# Patient Record
Sex: Male | Born: 1975 | Race: White | Hispanic: No | Smoking: Current some day smoker
Health system: Southern US, Community
[De-identification: ages and names within clinical notes are randomized; demographics above are authoritative.]

---

## 2020-01-14 ENCOUNTER — Emergency Department (HOSPITAL_BASED_OUTPATIENT_CLINIC_OR_DEPARTMENT_OTHER): Payer: Managed Care, Other (non HMO)

## 2020-01-14 ENCOUNTER — Encounter (HOSPITAL_BASED_OUTPATIENT_CLINIC_OR_DEPARTMENT_OTHER): Payer: Self-pay

## 2020-01-14 ENCOUNTER — Other Ambulatory Visit: Payer: Self-pay

## 2020-01-14 ENCOUNTER — Emergency Department (HOSPITAL_BASED_OUTPATIENT_CLINIC_OR_DEPARTMENT_OTHER)
Admission: EM | Admit: 2020-01-14 | Discharge: 2020-01-14 | Discharge status: Against Medical Advice | Payer: Managed Care, Other (non HMO) | Source: Home / Self Care | Attending: Emergency Medicine | Admitting: Emergency Medicine

## 2020-01-14 DIAGNOSIS — Z5329 Procedure and treatment not carried out because of patient's decision for other reasons: Secondary | ICD-10-CM | POA: Insufficient documentation

## 2020-01-14 DIAGNOSIS — B159 Hepatitis A without hepatic coma: Secondary | ICD-10-CM | POA: Diagnosis not present

## 2020-01-14 DIAGNOSIS — K819 Cholecystitis, unspecified: Secondary | ICD-10-CM

## 2020-01-14 DIAGNOSIS — F17200 Nicotine dependence, unspecified, uncomplicated: Secondary | ICD-10-CM | POA: Insufficient documentation

## 2020-01-14 DIAGNOSIS — R17 Unspecified jaundice: Secondary | ICD-10-CM | POA: Diagnosis not present

## 2020-01-14 DIAGNOSIS — K831 Obstruction of bile duct: Secondary | ICD-10-CM

## 2020-01-14 DIAGNOSIS — R1011 Right upper quadrant pain: Secondary | ICD-10-CM

## 2020-01-14 LAB — COMPREHENSIVE METABOLIC PANEL
ALT: 1307 U/L — ABNORMAL HIGH (ref 0–44)
AST: 691 U/L — ABNORMAL HIGH (ref 15–41)
Albumin: 3.3 g/dL — ABNORMAL LOW (ref 3.5–5.0)
Alkaline Phosphatase: 176 U/L — ABNORMAL HIGH (ref 38–126)
Anion gap: 8 (ref 5–15)
BUN: 12 mg/dL (ref 6–20)
CO2: 25 mmol/L (ref 22–32)
Calcium: 8.3 mg/dL — ABNORMAL LOW (ref 8.9–10.3)
Chloride: 105 mmol/L (ref 98–111)
Creatinine, Ser: 0.7 mg/dL (ref 0.61–1.24)
GFR calc Af Amer: >60 mL/min (ref >60)
GFR calc non Af Amer: >60 mL/min (ref >60)
Glucose, Bld: 117 mg/dL — ABNORMAL HIGH (ref 70–99)
Potassium: 3.6 mmol/L (ref 3.5–5.1)
Sodium: 138 mmol/L (ref 135–145)
Total Bilirubin: 6.3 mg/dL — ABNORMAL HIGH (ref 0.3–1.2)
Total Protein: 6.9 g/dL (ref 6.5–8.1)

## 2020-01-14 LAB — CBC WITH DIFFERENTIAL/PLATELET
Abs Immature Granulocytes: 0.03 10*3/uL (ref 0.00–0.07)
Basophils Absolute: 0.1 10*3/uL (ref 0.0–0.1)
Basophils Relative: 1 %
Eosinophils Absolute: 0.1 10*3/uL (ref 0.0–0.5)
Eosinophils Relative: 2 %
HCT: 41.1 % (ref 39.0–52.0)
Hemoglobin: 13.7 g/dL (ref 13.0–17.0)
Immature Granulocytes: 1 %
Lymphocytes Relative: 39 %
Lymphs Abs: 2.5 10*3/uL (ref 0.7–4.0)
MCH: 30.2 pg (ref 26.0–34.0)
MCHC: 33.3 g/dL (ref 30.0–36.0)
MCV: 90.7 fL (ref 80.0–100.0)
Monocytes Absolute: 0.7 10*3/uL (ref 0.1–1.0)
Monocytes Relative: 11 %
Neutro Abs: 3 10*3/uL (ref 1.7–7.7)
Neutrophils Relative %: 46 %
Platelets: 201 10*3/uL (ref 150–400)
RBC: 4.53 MIL/uL (ref 4.22–5.81)
RDW: 14.9 % (ref 11.5–15.5)
WBC: 6.4 10*3/uL (ref 4.0–10.5)
nRBC: 0 % (ref 0.0–0.2)

## 2020-01-14 LAB — URINALYSIS, ROUTINE W REFLEX MICROSCOPIC
Glucose, UA: 100 mg/dL — AB
Hgb urine dipstick: NEGATIVE
Ketones, ur: 15 mg/dL — AB
Nitrite: NEGATIVE
Protein, ur: 30 mg/dL — AB
Specific Gravity, Urine: >1.03 — ABNORMAL HIGH (ref 1.005–1.030)
pH: 5.5 (ref 5.0–8.0)

## 2020-01-14 LAB — URINALYSIS, MICROSCOPIC (REFLEX)

## 2020-01-14 LAB — LIPASE, BLOOD: Lipase: 40 U/L (ref 11–51)

## 2020-01-14 MED ORDER — AMOXICILLIN-POT CLAVULANATE 875-125 MG PO TABS: 1.0000 | ORAL_TABLET | Freq: Two times a day (BID) | ORAL | 0 refills | Status: DC

## 2020-01-14 MED ORDER — PIPERACILLIN-TAZOBACTAM 4.5 G IVPB
4.5000 g | Freq: Once | INTRAVENOUS | Status: DC
  Filled 2020-01-14: qty 100

## 2020-01-14 MED ORDER — PIPERACILLIN-TAZOBACTAM 3.375 G IVPB
3.3750 g | Freq: Once | INTRAVENOUS | Status: DC
  Filled 2020-01-14: qty 50

## 2020-01-14 NOTE — ED Triage Notes (Signed)
Pt also reports noticing his eyes are yellow.

## 2020-01-14 NOTE — ED Notes (Signed)
Pt refuses to be admitted, leave AMA, pt oriented by this RN and provider about his condition and encouraged to stay and get treatment. Pt still refuses and leaved AMA.

## 2020-01-14 NOTE — ED Provider Notes (Signed)
[facility] [place] EMERGENCY DEPARTMENT Provider Note   CSN: [id number] Arrival date & time: 01/14/20  1844     History Chief Complaint  Patient presents with  . Abdominal Pain    Antonio Bright is a 44 y.o. male.  HPI Patient with no prior medical problems reports about a week of upper abdominal discomfort, worse after eating and associated with nausea but no vomiting. He has noticed some yellowing of his eyes. Symptoms may have started after eating some undercooked chicken. He was seen at an outpatient Urgent Care and was directed to the ED for evaluation. He denies any drug or alcohol use. Does not take tylenol. Denies fever.     History reviewed. No pertinent past medical history.  There are no problems to display for this patient.   History reviewed. No pertinent surgical history.     No family history on file.  Social History   Tobacco Use  . Smoking status: Current Some Day Smoker  . Smokeless tobacco: Never Used  Substance Use Topics  . Alcohol use: Never  . Drug use: Never    Home Medications Prior to Admission medications   Medication Sig Start Date End Date Taking? Authorizing Provider  amoxicillin-clavulanate (AUGMENTIN) 875-125 MG tablet Take 1 tablet by mouth every 12 (twelve) hours. 01/14/20   Truddie Hidden, MD    Allergies    Patient has no known allergies.  Review of Systems   Review of Systems  Constitutional: Negative for fever.  HENT: Negative for congestion and sore throat.   Respiratory: Negative for cough and shortness of breath.   Cardiovascular: Negative for chest pain.  Gastrointestinal: Positive for abdominal pain and nausea. Negative for diarrhea and vomiting.  Genitourinary: Negative for dysuria.  Musculoskeletal: Negative for myalgias.  Skin: Positive for color change. Negative for rash.  Neurological: Negative for headaches.  Psychiatric/Behavioral: Negative for behavioral problems.    Physical Exam Updated Vital  Signs BP 125/70 (BP Location: Right Arm)   Pulse 88   Temp 98.5 F (36.9 C) (Oral)   Resp 18   Ht 5\' 11"  (1.803 m)   Wt 104.3 kg   SpO2 95%   BMI 32.08 kg/m   Physical Exam Constitutional:      Appearance: Normal appearance.  HENT:     Head: Normocephalic and atraumatic.     Nose: Nose normal.     Mouth/Throat:     Mouth: Mucous membranes are moist.  Eyes:     General: Scleral icterus (mild) present.     Extraocular Movements: Extraocular movements intact.     Conjunctiva/sclera: Conjunctivae normal.  Cardiovascular:     Rate and Rhythm: Normal rate.  Pulmonary:     Effort: Pulmonary effort is normal.     Breath sounds: Normal breath sounds.  Abdominal:     General: Abdomen is flat.     Palpations: Abdomen is soft.     Tenderness: There is abdominal tenderness (mild) in the right upper quadrant. Negative signs include Murphy's sign.  Musculoskeletal:        General: No swelling. Normal range of motion.     Cervical back: Neck supple.  Skin:    General: Skin is warm and dry.  Neurological:     General: No focal deficit present.     Mental Status: He is alert.  Psychiatric:        Mood and Affect: Mood normal.     ED Results / Procedures / Treatments   Labs (all labs  ordered are listed, but only abnormal results are displayed) Labs Reviewed  COMPREHENSIVE METABOLIC PANEL - Abnormal; Notable for the following components:      Result Value   Glucose, Bld 117 (*)    Calcium 8.3 (*)    Albumin 3.3 (*)    AST 691 (*)    ALT 1,307 (*)    Alkaline Phosphatase 176 (*)    Total Bilirubin 6.3 (*)    All other components within normal limits  URINALYSIS, ROUTINE W REFLEX MICROSCOPIC - Abnormal; Notable for the following components:   Color, Urine AMBER (*)    Specific Gravity, Urine >1.030 (*)    Glucose, UA 100 (*)    Bilirubin Urine LARGE (*)    Ketones, ur 15 (*)    Protein, ur 30 (*)    Leukocytes,Ua TRACE (*)    All other components within normal limits    URINALYSIS, MICROSCOPIC (REFLEX) - Abnormal; Notable for the following components:   Bacteria, UA MANY (*)    All other components within normal limits  SARS CORONAVIRUS 2 (TAT 6-24 HRS)  CBC WITH DIFFERENTIAL/PLATELET  LIPASE, BLOOD    EKG None  Radiology US Abdomen Limited RUQ  Result Date: 01/14/2020 CLINICAL DATA:  Postprandial upper abdominal pain with nausea for 6 days EXAM: ULTRASOUND ABDOMEN LIMITED RIGHT UPPER QUADRANT COMPARISON:  None. FINDINGS: Gallbladder: Gallbladder appears partially contracted. There is heterogeneous biliary sludge in the gallbladder including several partially echogenic shadowing foci, likely partially calcified and/or pneumatized gallstones largest measuring up to 5 mm in size. Gallbladder wall thickness measures up to 6 mm, which is thickened. Small volume of pericholecystic fluid is noted. Sonographic Eulah Pont sign is reportedly positive. Common bile duct: Diameter: Poorly visualized. Proximal visible portion measures 4 mm, nondilated. Liver: Diffusely increased hepatic echogenicity with loss of definition of the portal triads and diminished posterior through transmission compatible with hepatic steatosis. No focal lesion identified. Portal vein is patent on color Doppler imaging with normal direction of blood flow towards the liver. Other: None. IMPRESSION: Features compatible with acute cholecystitis including gallbladder wall thickening, pericholecystic fluid, and a positive sonographic Murphy sign. Gallbladder appears contracted around heterogeneous biliary sludge and echogenic foci, likely gallstones. Increased hepatic echogenicity, most often compatible with hepatic steatosis. Electronically Signed   By: Kreg Shropshire M.D.   On: 01/14/2020 20:29    Procedures Procedures (including critical care time)  Medications Ordered in ED Medications  piperacillin-tazobactam (ZOSYN) IVPB 3.375 g (3.375 g Intravenous Not Given 01/14/20 2253)    ED Course  I have  reviewed the triage vital signs and the nursing notes.  Pertinent labs & imaging results that were available during my care of the patient were reviewed by me and considered in my medical decision making (see chart for details).  Clinical Course as of Jan 14 2319  Sat Jan 14, 2020  2140 Patient with several days of upper abdominal pain, worse with eating and more recently some jaundice.  His labs were reviewed including elevated liver enzymes with a bilirubin of 6.3.  His white blood cell count is normal but his ultrasound is concerning for cholecystitis.  Will discuss with general surgery on-call.  Patient is resting comfortably in no distress.   [CS]  2202 Spoke with Dr. Cliffton Asters, general surgery and reviewed the lab and imaging findings.  He is concerned about a choledocholithiasis with ascending cholangitis and recommends admission to the medicine service for IV antibiotics and consultation with GI prior to cholecystectomy.  Zosyn has been  ordered and the hospitalist have been paged.   [CS]  2224 Spoke with Dr. Onalee Hua, Hospitalist who will admit the patient. She requests that GI also be consulted.    [CS]  2247 Spoke with Dr. Matthias Hughs, GI who agrees with the plan for admission, Abx and will anticipate ERCP tomorrow.    [CS]  2249 I returned to the room to discuss the above plan with the patient but he is sitting up in bed dressed. He now states he cannot stay in the hospital despite me telling him earlier that this would be the plan. He states he wants to spend some time with his family because this is his only weekend off and that he will return on Monday. He understands that he could have worsening pain, sepsis from infection, permanent liver damage or death. He understands the benefits of being admitted to the hospital but still refuses admission or IV Abx. He will agree to take oral antibiotics. He understands that he is welcome to return at any time and he understands that should he choose to do  so, he should go to the ER at [facility] or [facility].    [CS]    Clinical Course User Index [CS] Pollyann Savoy, MD   Final Clinical Impression(s) / ED Diagnoses Final diagnoses:  Cholecystitis  Obstructive jaundice    Rx / DC Orders ED Discharge Orders         Ordered    amoxicillin-clavulanate (AUGMENTIN) 875-125 MG tablet  Every 12 hours     01/14/20 2254           Pollyann Savoy, MD 01/14/20 2320

## 2020-01-14 NOTE — ED Notes (Signed)
Pt. Is aware that urine sample is needed.

## 2020-01-14 NOTE — ED Triage Notes (Signed)
Pt arrives ambulatory to ED with reports of eating undercooked chicken on Thursday. C/O chills and "feeling bad", with some abdominal pain and dark urine. Also reports decreased PO intake.

## 2020-01-14 NOTE — ED Notes (Signed)
Attempt to give Abx IV and collect a Covid test from the pt. Pt refuses states he will need to talk to the provider prior to this RN even though him. Provider notified.

## 2020-01-16 ENCOUNTER — Inpatient Hospital Stay (HOSPITAL_COMMUNITY): Payer: Managed Care, Other (non HMO)

## 2020-01-16 ENCOUNTER — Encounter (HOSPITAL_COMMUNITY): Payer: Self-pay | Admitting: Emergency Medicine

## 2020-01-16 ENCOUNTER — Other Ambulatory Visit: Payer: Self-pay

## 2020-01-16 ENCOUNTER — Inpatient Hospital Stay (HOSPITAL_COMMUNITY)
Admission: EM | Admit: 2020-01-16 | Discharge: 2020-01-17 | DRG: 443 | Disposition: A | Discharge status: Home / Self Care | Payer: Managed Care, Other (non HMO) | Attending: Family Medicine | Admitting: Family Medicine

## 2020-01-16 DIAGNOSIS — R634 Abnormal weight loss: Secondary | ICD-10-CM | POA: Diagnosis present

## 2020-01-16 DIAGNOSIS — Z6832 Body mass index (BMI) 32.0-32.9, adult: Secondary | ICD-10-CM | POA: Diagnosis not present

## 2020-01-16 DIAGNOSIS — F17210 Nicotine dependence, cigarettes, uncomplicated: Secondary | ICD-10-CM | POA: Diagnosis present

## 2020-01-16 DIAGNOSIS — R109 Unspecified abdominal pain: Secondary | ICD-10-CM

## 2020-01-16 DIAGNOSIS — Z87730 Personal history of (corrected) cleft lip and palate: Secondary | ICD-10-CM

## 2020-01-16 DIAGNOSIS — R21 Rash and other nonspecific skin eruption: Secondary | ICD-10-CM | POA: Diagnosis present

## 2020-01-16 DIAGNOSIS — E669 Obesity, unspecified: Secondary | ICD-10-CM | POA: Diagnosis present

## 2020-01-16 DIAGNOSIS — Z20822 Contact with and (suspected) exposure to covid-19: Secondary | ICD-10-CM | POA: Diagnosis present

## 2020-01-16 DIAGNOSIS — R945 Abnormal results of liver function studies: Secondary | ICD-10-CM | POA: Diagnosis not present

## 2020-01-16 DIAGNOSIS — K7689 Other specified diseases of liver: Secondary | ICD-10-CM | POA: Diagnosis not present

## 2020-01-16 DIAGNOSIS — K760 Fatty (change of) liver, not elsewhere classified: Secondary | ICD-10-CM | POA: Diagnosis present

## 2020-01-16 DIAGNOSIS — R17 Unspecified jaundice: Secondary | ICD-10-CM | POA: Diagnosis present

## 2020-01-16 DIAGNOSIS — K810 Acute cholecystitis: Secondary | ICD-10-CM

## 2020-01-16 DIAGNOSIS — B159 Hepatitis A without hepatic coma: Principal | ICD-10-CM | POA: Diagnosis present

## 2020-01-16 DIAGNOSIS — K8050 Calculus of bile duct without cholangitis or cholecystitis without obstruction: Secondary | ICD-10-CM

## 2020-01-16 LAB — COMPREHENSIVE METABOLIC PANEL
ALT: 964 U/L — ABNORMAL HIGH (ref 0–44)
AST: 309 U/L — ABNORMAL HIGH (ref 15–41)
Albumin: 3.6 g/dL (ref 3.5–5.0)
Alkaline Phosphatase: 207 U/L — ABNORMAL HIGH (ref 38–126)
Anion gap: 9 (ref 5–15)
BUN: 18 mg/dL (ref 6–20)
CO2: 25 mmol/L (ref 22–32)
Calcium: 9.1 mg/dL (ref 8.9–10.3)
Chloride: 107 mmol/L (ref 98–111)
Creatinine, Ser: 0.75 mg/dL (ref 0.61–1.24)
GFR calc Af Amer: >60 mL/min (ref >60)
GFR calc non Af Amer: >60 mL/min (ref >60)
Glucose, Bld: 121 mg/dL — ABNORMAL HIGH (ref 70–99)
Potassium: 3.6 mmol/L (ref 3.5–5.1)
Sodium: 141 mmol/L (ref 135–145)
Total Bilirubin: 6.5 mg/dL — ABNORMAL HIGH (ref 0.3–1.2)
Total Protein: 8 g/dL (ref 6.5–8.1)

## 2020-01-16 LAB — URINALYSIS, ROUTINE W REFLEX MICROSCOPIC
Glucose, UA: NEGATIVE mg/dL
Hgb urine dipstick: NEGATIVE
Ketones, ur: NEGATIVE mg/dL
Leukocytes,Ua: NEGATIVE
Nitrite: NEGATIVE
Protein, ur: NEGATIVE mg/dL
Specific Gravity, Urine: 1.025 (ref 1.005–1.030)
pH: 5 (ref 5.0–8.0)

## 2020-01-16 LAB — CBC
HCT: 46.2 % (ref 39.0–52.0)
Hemoglobin: 15.4 g/dL (ref 13.0–17.0)
MCH: 30.3 pg (ref 26.0–34.0)
MCHC: 33.3 g/dL (ref 30.0–36.0)
MCV: 90.8 fL (ref 80.0–100.0)
Platelets: 260 10*3/uL (ref 150–400)
RBC: 5.09 MIL/uL (ref 4.22–5.81)
RDW: 15.3 % (ref 11.5–15.5)
WBC: 7 10*3/uL (ref 4.0–10.5)
nRBC: 0 % (ref 0.0–0.2)

## 2020-01-16 LAB — RESPIRATORY PANEL BY RT PCR (FLU A&B, COVID)
Influenza A by PCR: NEGATIVE
Influenza B by PCR: NEGATIVE
SARS Coronavirus 2 by RT PCR: NEGATIVE

## 2020-01-16 LAB — LIPASE, BLOOD: Lipase: 44 U/L (ref 11–51)

## 2020-01-16 MED ORDER — GADOBUTROL 1 MMOL/ML IV SOLN
10.0000 mL | Freq: Once | INTRAVENOUS | Status: AC | PRN
  Administered 2020-01-16: 10 mL via INTRAVENOUS

## 2020-01-16 MED ORDER — ONDANSETRON HCL 4 MG PO TABS: 4.0000 mg | ORAL_TABLET | Freq: Four times a day (QID) | ORAL | Status: DC | PRN

## 2020-01-16 MED ORDER — SODIUM CHLORIDE 0.9 % IV SOLN: 250.0000 mL | INTRAVENOUS | Status: DC | PRN

## 2020-01-16 MED ORDER — PIPERACILLIN-TAZOBACTAM 3.375 G IVPB
3.3750 g | Freq: Three times a day (TID) | INTRAVENOUS | Status: DC
  Administered 2020-01-16 – 2020-01-17 (×3): 3.375 g via INTRAVENOUS
  Filled 2020-01-16 (×4): qty 50

## 2020-01-16 MED ORDER — HEPARIN SODIUM (PORCINE) 5000 UNIT/ML IJ SOLN: 5000.0000 [IU] | Freq: Three times a day (TID) | INTRAMUSCULAR | Status: DC

## 2020-01-16 MED ORDER — SODIUM CHLORIDE 0.9% FLUSH: 3.0000 mL | INTRAVENOUS | Status: DC | PRN

## 2020-01-16 MED ORDER — ONDANSETRON HCL 4 MG/2ML IJ SOLN: 4.0000 mg | Freq: Four times a day (QID) | INTRAMUSCULAR | Status: DC | PRN

## 2020-01-16 MED ORDER — SODIUM CHLORIDE 0.9% FLUSH
3.0000 mL | Freq: Two times a day (BID) | INTRAVENOUS | Status: DC
  Administered 2020-01-17: 3 mL via INTRAVENOUS

## 2020-01-16 MED ORDER — NICOTINE 14 MG/24HR TD PT24
14.0000 mg | MEDICATED_PATCH | Freq: Every day | TRANSDERMAL | Status: DC
  Filled 2020-01-16: qty 1

## 2020-01-16 MED ORDER — SODIUM CHLORIDE 0.9% FLUSH: 3.0000 mL | Freq: Once | INTRAVENOUS | Status: DC

## 2020-01-16 MED ORDER — POTASSIUM CHLORIDE IN NACL 20-0.45 MEQ/L-% IV SOLN
INTRAVENOUS | Status: DC
  Filled 2020-01-16: qty 1000

## 2020-01-16 MED ORDER — MORPHINE SULFATE (PF) 2 MG/ML IV SOLN: 2.0000 mg | INTRAVENOUS | Status: DC | PRN

## 2020-01-16 NOTE — ED Notes (Signed)
Called report to charge on 6th floor for pt transfer to the floor.

## 2020-01-16 NOTE — Progress Notes (Signed)
Patient ID: Antonio Bright, male   DOB: 29-Sep-1976, 44 y.o.   MRN: [id number]    Brief GI note  Notified by ER, patient with acute cholecystitis, and significantly elevated LFTs when he was evaluated 2 days ago, and declined admission.  Patient returned today with persistent abdominal pain.  Labs are pending today.  Will order MRCP this evening, and plan to follow-up in a.m. with complete GI note.

## 2020-01-16 NOTE — ED Provider Notes (Signed)
[facility]-EMERGENCY DEPT [facility] Emergency Department Provider Note MRN:  [id number]  Arrival date & time: 01/16/20     Chief Complaint   Abdominal Pain   History of Present Illness   Antonio Bright is a 44 y.o. year-old male with no pertinent past medical history presenting to the ED with chief complaint of abdominal pain.  Continued right upper quadrant abdominal pain, present for 3 to 5 days, was told that he had a gallbladder infection late last week, left AGAINST MEDICAL ADVICE.  He has returned today to undergo the necessary testing.  Did not want to sit around the hospital all weekend without surgery.  Denies any fever, intermittent chills, no chest pain or shortness of breath, right upper quadrant abdominal pain is mild to moderate, constant, largely unchanged.  He has noticed that he is becoming more jaundiced over the past few days.  Review of Systems  A complete 10 system review of systems was obtained and all systems are negative except as noted in the HPI and PMH.   Patient's Health History   History reviewed. No pertinent past medical history.  History reviewed. No pertinent surgical history.  No family history on file.  Social History   Socioeconomic History  . Marital status: Unknown    Spouse name: Not on file  . Number of children: Not on file  . Years of education: Not on file  . Highest education level: Not on file  Occupational History  . Not on file  Tobacco Use  . Smoking status: Current Some Day Smoker  . Smokeless tobacco: Never Used  Substance and Sexual Activity  . Alcohol use: Never  . Drug use: Never  . Sexual activity: Not on file  Other Topics Concern  . Not on file  Social History Narrative  . Not on file   Social Determinants of Health   Financial Resource Strain:   . Difficulty of Paying Living Expenses: Not on file  Food Insecurity:   . Worried About Programme researcher, broadcasting/film/video in the Last Year: Not on file  . Ran Out of Food in the  Last Year: Not on file  Transportation Needs:   . Lack of Transportation (Medical): Not on file  . Lack of Transportation (Non-Medical): Not on file  Physical Activity:   . Days of Exercise per Week: Not on file  . Minutes of Exercise per Session: Not on file  Stress:   . Feeling of Stress : Not on file  Social Connections:   . Frequency of Communication with Friends and Family: Not on file  . Frequency of Social Gatherings with Friends and Family: Not on file  . Attends Religious Services: Not on file  . Active Member of Clubs or Organizations: Not on file  . Attends Banker Meetings: Not on file  . Marital Status: Not on file  Intimate Partner Violence:   . Fear of Current or Ex-Partner: Not on file  . Emotionally Abused: Not on file  . Physically Abused: Not on file  . Sexually Abused: Not on file     Physical Exam   Vitals:   01/16/20 1806 01/16/20 2057  BP: 124/77 112/74  Pulse: 75 63  Resp: 20 16  Temp: 98.3 F (36.8 C) 98.3 F (36.8 C)  SpO2: 99% 98%    CONSTITUTIONAL: Well-appearing, NAD NEURO:  Alert and oriented x 3, no focal deficits EYES:  eyes equal and reactive ENT/NECK:  no LAD, no JVD CARDIO: Regular rate, well-perfused,  normal S1 and S2 PULM:  CTAB no wheezing or rhonchi GI/GU:  normal bowel sounds, non-distended, mild right upper quadrant tenderness to palpation MSK/SPINE:  No gross deformities, no edema SKIN:  no rash, atraumatic PSYCH:  Appropriate speech and behavior  *Additional and/or pertinent findings included in MDM below  Diagnostic and Interventional Summary    EKG Interpretation  Date/Time:    Ventricular Rate:    PR Interval:    QRS Duration:   QT Interval:    QTC Calculation:   R Axis:     Text Interpretation:        Cardiac Monitoring Interpretation:  Labs Reviewed  COMPREHENSIVE METABOLIC PANEL - Abnormal; Notable for the following components:      Result Value   Glucose, Bld 121 (*)    AST 309 (*)     ALT 964 (*)    Alkaline Phosphatase 207 (*)    Total Bilirubin 6.5 (*)    All other components within normal limits  URINALYSIS, ROUTINE W REFLEX MICROSCOPIC - Abnormal; Notable for the following components:   Color, Urine AMBER (*)    Bilirubin Urine MODERATE (*)    All other components within normal limits  RESPIRATORY PANEL BY RT PCR (FLU A&B, COVID)  LIPASE, BLOOD  CBC  CBC  COMPREHENSIVE METABOLIC PANEL  PROTIME-INR  HIV ANTIBODY (ROUTINE TESTING W REFLEX)  APTT  HEMOGLOBIN A1C    MR ABDOMEN MRCP W WO CONTAST  Final Result    MR 3D Recon At Scanner  Final Result      Medications  sodium chloride flush (NS) 0.9 % injection 3 mL (has no administration in time range)  heparin injection 5,000 Units (0 Units Subcutaneous Hold 01/16/20 2038)  sodium chloride flush (NS) 0.9 % injection 3 mL (3 mLs Intravenous Not Given 01/16/20 2045)  sodium chloride flush (NS) 0.9 % injection 3 mL (has no administration in time range)  0.9 %  sodium chloride infusion (has no administration in time range)  ondansetron (ZOFRAN) tablet 4 mg (has no administration in time range)    Or  ondansetron (ZOFRAN) injection 4 mg (has no administration in time range)  morphine 2 MG/ML injection 2-4 mg (has no administration in time range)  piperacillin-tazobactam (ZOSYN) IVPB 3.375 g (3.375 g Intravenous New Bag/Given 01/16/20 2127)  0.45 % NaCl with KCl 20 mEq / L infusion ( Intravenous New Bag/Given 01/16/20 2044)  nicotine (NICODERM CQ - dosed in mg/24 hours) patch 14 mg (14 mg Transdermal Not Given 01/16/20 2044)  gadobutrol (GADAVIST) 1 MMOL/ML injection 10 mL (10 mLs Intravenous Contrast Given 01/16/20 1919)     Procedures  /  Critical Care .Critical Care Performed by: Maudie Flakes, MD Authorized by: Maudie Flakes, MD   Critical care provider statement:    Critical care time (minutes):  32   Critical care was necessary to treat or prevent imminent or life-threatening deterioration of the following  conditions: Acute cholecystitis, concern for acute choledocholithiasis with hepatic dysfunction.   Critical care was time spent personally by me on the following activities:  Discussions with consultants, evaluation of patient's response to treatment, examination of patient, ordering and performing treatments and interventions, ordering and review of laboratory studies, ordering and review of radiographic studies, pulse oximetry, re-evaluation of patient's condition, obtaining history from patient or surrogate and review of old charts    ED Course and Medical Decision Making  I have reviewed the triage vital signs, the nursing notes, and pertinent available records from  the EMR.  Pertinent labs & imaging results that were available during my care of the patient were reviewed by me and considered in my medical decision making (see below for details).     Signs of acute cholecystitis on ultrasound but also with some concerns for choledocholithiasis given his recent LFTs.  Per chart review, patient left AGAINST MEDICAL ADVICE a few days ago.  General surgery was consulted who recommended hospitalist admission and GI involvement for ERCP or MRCP.  Patient is now in full agreement that he will stay for what is necessary.  Currently without fever, no signs of peritonitis on exam, largely well-appearing, mildly jaundiced.  Doubt ascending cholangitis at this time, but still consult hospitalist and gastroenterology for inpatient management.  Patient took Augmentin this morning.  Accepted for admission by hospitalist service, lobe our GI will follow.  Elmer Sow. Pilar Plate, MD [facility] Emergency Medicine [facility] [email or web address]  Final Clinical Impressions(s) / ED Diagnoses     ICD-10-CM   1. Acute cholecystitis  K81.0   2. Choledocholithiasis  K80.50   3. Abdominal pain  R10.9 MR 3D Recon At Scanner    MR 3D Recon At Scanner    ED Discharge Orders    None       Discharge  Instructions Discussed with and Provided to Patient:   Discharge Instructions   None       Sabas Sous, MD 01/16/20 2333

## 2020-01-16 NOTE — Progress Notes (Signed)
Pharmacy Antibiotic Note  Antonio Bright is a 44 y.o. male admitted on 01/16/2020 with acute cholecystitis.  Pharmacy has been consulted for Zosyn dosing.  Plan: Zosyn 3.375g IV q8h (each dose infused over 4 hours) Monitor renal function, cultures, clinical course     Temp (24hrs), Avg:98.1 F (36.7 C), Min:98.1 F (36.7 C), Max:98.1 F (36.7 C)  Recent Labs  Lab 01/14/20 1950 01/16/20 1552  WBC 6.4 7.0  CREATININE 0.70 0.75    Estimated Creatinine Clearance: 143.3 mL/min (by C-G formula based on SCr of 0.75 mg/dL).    No Known Allergies  Antimicrobials this admission: 3/8 Zosyn >>  Dose adjustments this admission: --  Microbiology results: 3/8 Respiratory panel: sent   Thank you for allowing pharmacy to be a part of this patient's care.  Jamse Mead 01/16/2020 5:00 PM

## 2020-01-16 NOTE — Consult Note (Signed)
[facility] Surgery Consult Note  Antonio Bright 1976-03-27  [id number].    Requesting MD: Vance Gather Chief Complaint: Abdominal pain, dark urine, eyes turning yellow Reason for Consult: Possible choledocholithiasis/ascending cholangitis   HPI: Patient is a 44 year old male who was seen at [facility] on 01/14/2020.  His primary complaint was postprandial upper abdominal pain with nausea x 6 days.    Work-up at that time showed a WBC of 6.4, hemoglobin of 13, hematocrit of 41, platelets 201,000.  Glucose 117, calcium 8.3, alk phos 176, AST 691, ALT 1307, total bilirubin 6.3.  Lipase was 40.  Abdominal ultrasound shows the gallbladder was partially contracted there is biliary sludge in the gallbladder including several partially echogenic shadowing foci suggestive of gallstones up to 5 mm.  The gallbladder wall thickness was 6 mm which is thickened.  There is small volume of pericholecystic fluid.  Common bile duct was poorly visualized but measures at 4 mm proximally.  This was compatible with acute cholecystitis, sludge and likely cholelithiasis, increased hepatic echogenicity compatible with hepatic steatosis.  It was recommended the patient be admitted and placed on IV Zosyn.  Apparently the patient opted to go home and come back today.  He presented this afternoon to the ED with complaints of right upper quadrant pain/discomfort nausea, and returned today thinking he would not be able to have any surgery until Monday or Tuesday of this week.  Labs today: WBC 7.0, hemoglobin 15.4, hematocrit 46.2, platelets 260,000.  Sodium is 141, potassium is 3.6, glucose 121, creatinine 0.75, alk phos 207, AST 309, ALT 964, total bilirubin 6.5.  Urinalysis on 3 6 showed large amount of bilirubin in the urine, today he has a moderate amount of bilirubin in the urine.  We are asked to see.   ROS: Review of Systems  Constitutional: Positive for weight loss (~ 10 lbs over the last week).   HENT: Negative.   Eyes: Negative.   Respiratory: Positive for cough (occasional).   Cardiovascular: Negative.   Gastrointestinal: Positive for heartburn and nausea. Negative for abdominal pain, blood in stool, constipation, diarrhea, melena and vomiting.  Genitourinary: Negative.   Musculoskeletal: Negative.   Skin: Positive for rash (rash lower abdomen for a few weeks).  Neurological: Negative.   Psychiatric/Behavioral: Negative.     No family history on file.  History reviewed. No pertinent past medical history.  History reviewed. No pertinent surgical history. 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 He has never used smokeless tobacco. He reports that he does not drink alcohol or use drugs. Tobacco: 1.5 PPD x 28 years, down to about 4 per day last week when he has been feeling bad.  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 Authorizing Provider  amoxicillin-clavulanate (AUGMENTIN) 875-125 MG tablet Take 1 tablet by mouth every 12 (twelve) hours. 01/14/20  Yes Truddie Hidden, MD     Blood pressure 118/70, pulse 77, temperature 98.1 F (36.7 C), temperature source Oral, resp. rate 16, SpO2 99 %.   Physical Exam:  General: pleasant, WD, WN white male who is laying in bed in NAD HEENT: head is normocephalic, atraumatic.  Sclera are mildly jaundice.  pupils are equal.   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 Heart: regular, rate, and rhythm.  Normal s1,s2. No obvious murmurs, gallops, or rubs noted.  Palpable radial and pedal pulses bilaterally Lungs: CTAB, no wheezes, rhonchi, or rales noted.  Some upper airway wheeze with deep inspiration, Respiratory effort nonlabored Abd: soft, NT, ND, +BS, no masses,  hernias, or organomegaly MS: all 4 extremities are symmetrical with no cyanosis, clubbing, or edema. Skin: warm and dry with no masses, lesions, or rashes Neuro: Cranial nerves 2-12 grossly intact, sensation is normal throughout Psych: A&Ox3 with an appropriate affect.   Results for orders placed or performed during the hospital encounter of 01/16/20 (from the past 48 hour(s))  CBC     Status: None   Collection Time: 01/16/20  3:52 PM  Result Value Ref Range   WBC 7.0 4.0 - 10.5 K/uL   RBC 5.09 4.22 - 5.81 MIL/uL   Hemoglobin 15.4 13.0 - 17.0 g/dL   HCT 46.2 39.0 - 52.0 %   MCV 90.8 80.0 - 100.0 fL   MCH 30.3 26.0 - 34.0 pg   MCHC 33.3 30.0 - 36.0 g/dL   RDW 15.3 11.5 - 15.5 %   Platelets 260 150 - 400 K/uL   nRBC 0.0 0.0 - 0.2 %    Comment: Performed at [facility], [place] [address] [place], [place] [address]  Urinalysis, Routine w reflex microscopic     Status: Abnormal   Collection Time: 01/16/20  4:16 PM  Result Value Ref Range   Color, Urine AMBER (A) YELLOW    Comment: BIOCHEMICALS MAY BE AFFECTED BY COLOR   APPearance CLEAR CLEAR   Specific Gravity, Urine 1.025 1.005 - 1.030   pH 5.0 5.0 - 8.0   Glucose, UA NEGATIVE NEGATIVE mg/dL   Hgb urine dipstick NEGATIVE NEGATIVE   Bilirubin Urine MODERATE (A) NEGATIVE   Ketones, ur NEGATIVE NEGATIVE mg/dL   Protein, ur NEGATIVE NEGATIVE mg/dL   Nitrite NEGATIVE NEGATIVE   Leukocytes,Ua NEGATIVE NEGATIVE    Comment: Performed at [facility], [place] [address] [place], [place] [address]   US Abdomen Limited RUQ  Result Date: 01/14/2020 CLINICAL DATA:  Postprandial upper abdominal pain with nausea for 6 days EXAM: ULTRASOUND ABDOMEN LIMITED RIGHT UPPER QUADRANT COMPARISON:  None. FINDINGS: Gallbladder: Gallbladder appears partially contracted. There is heterogeneous biliary sludge in the gallbladder including several partially echogenic shadowing foci, likely partially calcified and/or pneumatized  gallstones largest measuring up to 5 mm in size. Gallbladder wall thickness measures up to 6 mm, which is thickened. Small volume of pericholecystic fluid is noted. Sonographic Percell Miller sign is reportedly positive. Common bile duct: Diameter: Poorly visualized. Proximal visible portion measures 4 mm, nondilated. Liver: Diffusely increased hepatic echogenicity with loss of definition of the portal triads and diminished posterior through transmission compatible with hepatic steatosis. No focal lesion identified. Portal vein is patent on color Doppler imaging with normal direction of blood flow towards the liver. Other: None. IMPRESSION: Features compatible with acute cholecystitis including gallbladder wall thickening, pericholecystic fluid, and a positive sonographic Murphy sign. Gallbladder appears contracted around heterogeneous biliary sludge and echogenic foci, likely gallstones. Increased hepatic echogenicity, most often compatible with hepatic steatosis. Electronically Signed   By: Lovena Le M.D.   On: 01/14/2020 20:29      Assessment/Plan  Cholelithiasis/choledocholithiasis - possible cholecystitis Jaundice with elevated LFT Tobacco use  Plan:  GI has already ordered an MRI.  Agree with admit, I would start Zosyn, he has been on  Augmentin at home.  Rehydrate, clears after his MRI, then NPO after midnight.  We will see him again in the AM.  Repeat labs in [place], [facility] Surgery 01/16/2020, 4:36 PM Please see Amion for pager number during day hours 7:00am-4:30pm

## 2020-01-16 NOTE — ED Notes (Signed)
Pt is aware we need a urine specimen.  °

## 2020-01-16 NOTE — ED Notes (Signed)
Hospitalist at pt bedside.

## 2020-01-16 NOTE — H&P (Signed)
History and Physical   Kashton Mcartor IZT:[id number] DOB: 01-Jan-1976 DOA: 01/16/2020  Referring MD/NP/PA: Dr. Sedonia Small PCP: Patient, No Pcp Per Outpatient Specialists: None  Patient coming from: Home  Chief Complaint: Jaundice, abdominal pain  HPI: Menno Vanbergen is a 44 y.o. male with no significant medical history or follow up who presented to the ED today with continued abdominal pain and jaundice after leaving the ED on 3/6 having been diagnosed with cholecystitis with obstructive jaundice. Work up at that time revealed elevated LFTs (AST 691, ALT 1,307, TBili 6.3), unremarkable CBC. RUQ U/S demonstrated a thickened, contracted gallbladder with pericholecystic fluid and sludge as well as only partially visualized CBD at 52m and increased hepatic echogenicity consistent with steatosis. Admission was recommended, though patient left AMA, but returns now with constant, mild waxing/waning discomfort in the right upper abdomen that is not worse with movement nor eating (ate mac and cheese just prior to presenting back to ED), and for which no medications are taken other than augmentin. It is stable from prior. He denies nausea, vomiting, diarrhea, constipation. CBC unremarkable. CMP confirms LFT elevations. Patient is admitted, made NPO, started on zosyn .  - ROS: Denies fever, chills, weight loss, changes in vision or hearing, headache, cough, sore throat, chest pain, palpitations, shortness of breath, blood in stool, change in bladder habits, myalgias, arthralgias, rash, and per HPI. All others reviewed and are negative.   - No known PMH, has not seen a PCP for years.  - Takes no medications regularly, the rare ibuprofen prn.  - NKDA - Surgical history of cleft lip and palate repair, tracheostomy. No known anesthesia complications - Smoked from age 89up until 4 days ago with a few years hiatus when first child was young. - Overall ~1 ppd, 1/2ppd recently. 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 Lives with wife, has 15yo and 18yo sons. No illicit drugs.  - No FH of clots reported  Physical Exam: Vitals:   01/16/20 1540 01/16/20 1619  BP: 133/88 118/70  Pulse: 80 77  Resp: 16 16  Temp: 98.1 F (36.7 C)   TempSrc: Oral   SpO2: 100% 99%   Constitutional: Nontoxic male in no distress, calm demeanor Eyes: Icteric sclerae ENMT: Masked throughout encounter Neck: Supple, no masses, no thyromegaly Respiratory: Non-labored breathing room air without accessory muscle use. Clear breath sounds to auscultation bilaterally Cardiovascular: Regular rate and rhythm, no murmurs, rubs, or gallops. No carotid bruits. No JVD. No LE edema. Palpable distal pulses. Abdomen: Normoactive bowel sounds. RUQ point tenderness, non-distended, and no masses palpated. No hepatosplenomegaly. GU: No indwelling catheter Musculoskeletal: No clubbing / cyanosis. No joint deformity upper and lower extremities. Good ROM, no contractures. Normal muscle tone.  Skin: Warm, dry, diffusely slightly jaundiced. Midline neck w/trach scar. No rashes, wounds, or ulcers.  Neurologic: CN II-XII grossly intact. Speech normal. No focal deficits in motor strength or sensation in all extremities.  Psychiatric: Alert and oriented x3. Normal judgment and insight. Mood euthymic with congruent affect.   Labs on Admission: I have personally reviewed following labs and imaging studies  CBC: Recent Labs  Lab 01/14/20 1950 01/16/20 1552  WBC 6.4 7.0  NEUTROABS 3.0  --   HGB 13.7 15.4  HCT 41.1 46.2  MCV 90.7 90.8  PLT 201 2382  Basic Metabolic Panel: Recent Labs  Lab 01/14/20 1950  NA 138  K 3.6  CL 105  CO2 25  GLUCOSE 117*  BUN 12  CREATININE 0.70  CALCIUM 8.3*   Liver Function Tests: Recent Labs  Lab 01/14/20 1950  AST 691*  ALT 1,307*  ALKPHOS 176*  BILITOT 6.3*  PROT 6.9  ALBUMIN 3.3*   Recent Labs  Lab 01/14/20 1950  LIPASE 40   Urine analysis:     Component Value Date/Time   COLORURINE AMBER (A) 01/14/2020 2154   APPEARANCEUR CLEAR 01/14/2020 2154   LABSPEC >1.030 (H) 01/14/2020 2154   PHURINE 5.5 01/14/2020 2154   GLUCOSEU 100 (A) 01/14/2020 2154   HGBUR NEGATIVE 01/14/2020 2154   BILIRUBINUR LARGE (A) 01/14/2020 2154   KETONESUR 15 (A) 01/14/2020 2154   PROTEINUR 30 (A) 01/14/2020 2154   NITRITE NEGATIVE 01/14/2020 2154   LEUKOCYTESUR TRACE (A) 01/14/2020 2154   Radiological Exams on Admission: US Abdomen Limited RUQ  Result Date: 01/14/2020 CLINICAL DATA:  Postprandial upper abdominal pain with nausea for 6 days EXAM: ULTRASOUND ABDOMEN LIMITED RIGHT UPPER QUADRANT COMPARISON:  None. FINDINGS: Gallbladder: Gallbladder appears partially contracted. There is heterogeneous biliary sludge in the gallbladder including several partially echogenic shadowing foci, likely partially calcified and/or pneumatized gallstones largest measuring up to 5 mm in size. Gallbladder wall thickness measures up to 6 mm, which is thickened. Small volume of pericholecystic fluid is noted. Sonographic Percell Miller sign is reportedly positive. Common bile duct: Diameter: Poorly visualized. Proximal visible portion measures 4 mm, nondilated. Liver: Diffusely increased hepatic echogenicity with loss of definition of the portal triads and diminished posterior through transmission compatible with hepatic steatosis. No focal lesion identified. Portal vein is patent on color Doppler imaging with normal direction of blood flow towards the liver. Other: None. IMPRESSION: Features compatible with acute cholecystitis including gallbladder wall thickening, pericholecystic fluid, and a positive sonographic Murphy sign. Gallbladder appears contracted around heterogeneous biliary sludge and echogenic foci, likely gallstones. Increased hepatic echogenicity, most often compatible with hepatic steatosis. Electronically Signed   By: Lovena Le M.D.   On: 01/14/2020 20:29    EKG:  Ordered  Assessment/Plan Active Problems:   Acute cholecystitis   Acute cholecystitis with obstruction/cholestasis:  - Zosyn - prn antiemetics and analgesia, though not currently experiencing these symptoms.  - MRCP recommended by GI who will evaluate formally in AM - General surgery consulted. Will keep NPO (maintenance IVF overnight ordered), check labs in AM including INR, check ECG. No symptoms of heart failure and has no known history of anesthesia complications.   Obesity, hepatic steatosis:  - Check HbA1c.   Tobacco use:  - Nicotine patch - Will need cessation counseling  DVT prophylaxis: Heparin pending potential for procedure  Code Status: Full  Family Communication: None at bedside Disposition Plan: Home after admission Consults called: [facility], General Surgery  Admission status: Inpatient    Patrecia Pour, MD [facility] [email or web address] 01/16/2020, 4:35 PM

## 2020-01-16 NOTE — Progress Notes (Signed)
Call from MRI; they "are coming up in just a minute to get him for his MRI". Zosyn not started due to impending MRI.  Lina Sar, RN

## 2020-01-16 NOTE — ED Triage Notes (Signed)
Pt complaint of RUQ pain/discomfort and nausea; seen Saturday for "galbladder issues; they wanted me to stay but I went home since surgery wasn't going to be until Monday or Tuesday."

## 2020-01-17 ENCOUNTER — Inpatient Hospital Stay (HOSPITAL_COMMUNITY): Payer: Managed Care, Other (non HMO)

## 2020-01-17 ENCOUNTER — Encounter: Payer: Self-pay | Admitting: Physician Assistant

## 2020-01-17 ENCOUNTER — Other Ambulatory Visit: Payer: Self-pay

## 2020-01-17 DIAGNOSIS — B159 Hepatitis A without hepatic coma: Principal | ICD-10-CM

## 2020-01-17 DIAGNOSIS — B179 Acute viral hepatitis, unspecified: Secondary | ICD-10-CM

## 2020-01-17 DIAGNOSIS — R945 Abnormal results of liver function studies: Secondary | ICD-10-CM

## 2020-01-17 DIAGNOSIS — K7689 Other specified diseases of liver: Secondary | ICD-10-CM

## 2020-01-17 LAB — CBC
HCT: 43.1 % (ref 39.0–52.0)
Hemoglobin: 14.4 g/dL (ref 13.0–17.0)
MCH: 30.5 pg (ref 26.0–34.0)
MCHC: 33.4 g/dL (ref 30.0–36.0)
MCV: 91.3 fL (ref 80.0–100.0)
Platelets: 269 10*3/uL (ref 150–400)
RBC: 4.72 MIL/uL (ref 4.22–5.81)
RDW: 15.4 % (ref 11.5–15.5)
WBC: 6.4 10*3/uL (ref 4.0–10.5)
nRBC: 0 % (ref 0.0–0.2)

## 2020-01-17 LAB — COMPREHENSIVE METABOLIC PANEL
ALT: 806 U/L — ABNORMAL HIGH (ref 0–44)
AST: 260 U/L — ABNORMAL HIGH (ref 15–41)
Albumin: 3.6 g/dL (ref 3.5–5.0)
Alkaline Phosphatase: 200 U/L — ABNORMAL HIGH (ref 38–126)
Anion gap: 8 (ref 5–15)
BUN: 19 mg/dL (ref 6–20)
CO2: 27 mmol/L (ref 22–32)
Calcium: 9.1 mg/dL (ref 8.9–10.3)
Chloride: 108 mmol/L (ref 98–111)
Creatinine, Ser: 0.91 mg/dL (ref 0.61–1.24)
GFR calc Af Amer: >60 mL/min (ref >60)
GFR calc non Af Amer: >60 mL/min (ref >60)
Glucose, Bld: 103 mg/dL — ABNORMAL HIGH (ref 70–99)
Potassium: 3.8 mmol/L (ref 3.5–5.1)
Sodium: 143 mmol/L (ref 135–145)
Total Bilirubin: 5.7 mg/dL — ABNORMAL HIGH (ref 0.3–1.2)
Total Protein: 7.8 g/dL (ref 6.5–8.1)

## 2020-01-17 LAB — HEMOGLOBIN A1C
Hgb A1c MFr Bld: 5.6 % (ref 4.8–5.6)
Mean Plasma Glucose: 114.02 mg/dL

## 2020-01-17 LAB — HIV ANTIBODY (ROUTINE TESTING W REFLEX): HIV Screen 4th Generation wRfx: NONREACTIVE

## 2020-01-17 LAB — APTT: aPTT: 34 seconds (ref 24–36)

## 2020-01-17 LAB — PROTIME-INR
INR: 0.9 (ref 0.8–1.2)
Prothrombin Time: 12.3 seconds (ref 11.4–15.2)

## 2020-01-17 MED ORDER — TECHNETIUM TC 99M MEBROFENIN IV KIT
7.4100 | PACK | Freq: Once | INTRAVENOUS | Status: AC
  Administered 2020-01-17: 7.41 via INTRAVENOUS

## 2020-01-17 NOTE — Progress Notes (Signed)
Patient is discharged today. He is a pleasant Alert and Oriented X4 individual. Nurse provided patient teaching to patient all throughout the coarse of the day, Including medication side effects. During discharge teaching, nurse reminded patient the importance of follow up appointments for better medical outcomes. Patient stated understanding with teaching provided.

## 2020-01-17 NOTE — Discharge Instructions (Signed)
You have office appointment scheduled on March 30 at 1:30 PM, Mike Gip PA-C/[place]  [facility]   [phone] [address] third floor Please arrive 15 minutes prior to appointment  You are also scheduled to have labs done on 02/01/2020.  [address], please go to the basement lab anytime between 730 and 5 PM follow-up labs.

## 2020-01-17 NOTE — Consult Note (Addendum)
Consultation  Referring Provider:  Primary Care Physician:  Patient, No Pcp Per Primary Gastroenterologist: none/unassigned Reason for Consultation:  Acute abdominal pain, elevated  LFT's, abnormal imaging  HPI: Antonio Bright is a 44 y.o. male, who we were asked to see after he presented back to the emergency room yesterday with persistent complaints of abdominal pain and jaundice.  He had initially been in the emergency room on 01/14/2019 at [facility] with complaints of 2-week history of postprandial abdominal discomfort/pain, intermittent nausea without vomiting, poor appetite with weight loss of about 12 pounds over the prior 2 weeks and persistent fatigue.  He had not had any fever or chills, and no diarrhea. Ultrasound on 01/14/2020 showed a contracted gallbladder with sludge in the gallbladder and partially calcified or pneumatized stones, gallbladder wall thickened at 6 mm with a small amount of pericholecystic fluid and CBD was poorly seen.  Also noted hepatic steatosis.  Labs showed T bili of 6.3/alk phos 176/ALT 1307/AST 691.  Apparently patient was to be admitted but he declined to stay as he had been told that if he needed gallbladder surgery it probably would not occur until Monday. He presented again yesterday with persistent but not worsened symptoms. Repeat labs from today WBC 6.4, hemoglobin 14 platelets 269 T bili 5.7/alk phos 200/AST 260/ALT 86 INR 0.9  MRI/MRCP was ordered last night because of concerns for acute cholecystitis, possible choledocholithiasis.  This showed the gallbladder to be contracted and thick-walled with mild edema in the porta hepatis and tracking adjacent to the descending duodenum along with periportal edema and adjacent reactive lymph nodes likely reflecting acute cholecystitis, no biliary ductal dilation and no evidence of choledocholithiasis.  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 Acute hepatitis panel was done and returned positive for acute hepatitis A, hepatitis B and C-  Interestingly patient says over the past 2 days he has been feeling a bit better, some improvement in appetite and definitely has had less abdominal discomfort.  He is in generally good health with no known chronic medical problems.   History reviewed. No pertinent past medical history.  History reviewed. No pertinent surgical history.  Prior to Admission medications   Medication Sig Start Date End Date Taking? Authorizing Provider  amoxicillin-clavulanate (AUGMENTIN) 875-125 MG tablet Take 1 tablet by mouth every 12 (twelve) hours. 01/14/20  Yes Truddie Hidden, MD    Current Facility-Administered Medications  Medication Dose Route Frequency Provider Last Rate Last Admin  . 0.45 % NaCl with KCl 20 mEq / L infusion   Intravenous Continuous Patrecia Pour, MD   Stopped at 01/17/20 [phone]  . 0.9 %  sodium chloride infusion  250 mL Intravenous PRN Patrecia Pour, MD      . heparin injection 5,000 Units  5,000 Units Subcutaneous Q8H Patrecia Pour, MD   Stopped at 01/16/20 2038  . morphine 2 MG/ML injection 2-4 mg  2-4 mg Intravenous Q4H PRN Patrecia Pour, MD      . nicotine (NICODERM CQ - dosed in mg/24 hours) patch 14 mg  14 mg Transdermal Daily Vance Gather B, MD      . ondansetron [facility]) tablet 4 mg  4 mg Oral Q6H PRN Patrecia Pour, MD       Or  . ondansetron [facility]) injection 4 mg  4 mg Intravenous  Q6H PRN Patrecia Pour, MD      . piperacillin-tazobactam (ZOSYN) IVPB 3.375 g  3.375 g Intravenous Q8H Luiz Ochoa, RPH 12.5 mL/hr at 01/17/20 0237 3.375 g at 01/17/20 0237  . sodium chloride flush (NS) 0.9 % injection 3 mL  3 mL Intravenous Once Vance Gather B, MD      . sodium chloride flush (NS) 0.9 % injection 3 mL  3 mL Intravenous Q12H Vance Gather B, MD      . sodium chloride flush (NS)  0.9 % injection 3 mL  3 mL Intravenous PRN Patrecia Pour, MD        Allergies as of 01/16/2020  . (No Known Allergies)    No family history on file.  Social History   Socioeconomic History  . Marital status: Unknown    Spouse name: Not on file  . Number of children: Not on file  . Years of education: Not on file  . Highest education level: Not on file  Occupational History  . Not on file  Tobacco Use  . Smoking status: Current Some Day Smoker  . Smokeless tobacco: Never Used  Substance and Sexual Activity  . Alcohol use: Never  . Drug use: Never  . Sexual activity: Not on file  Other Topics Concern  . Not on file  Social History Narrative  . Not on file   Social Determinants of Health   Financial Resource Strain:   . Difficulty of Paying Living Expenses: Not on file  Food Insecurity:   . Worried About Charity fundraiser in the Last Year: Not on file  . Ran Out of Food in the Last Year: Not on file  Transportation Needs:   . Lack of Transportation (Medical): Not on file  . Lack of Transportation (Non-Medical): Not on file  Physical Activity:   . Days of Exercise per Week: Not on file  . Minutes of Exercise per Session: Not on file  Stress:   . Feeling of Stress : Not on file  Social Connections:   . Frequency of Communication with Friends and Family: Not on file  . Frequency of Social Gatherings with Friends and Family: Not on file  . Attends Religious Services: Not on file  . Active Member of Clubs or Organizations: Not on file  . Attends Archivist Meetings: Not on file  . Marital Status: Not on file  Intimate Partner Violence:   . Fear of Current or Ex-Partner: Not on file  . Emotionally Abused: Not on file  . Physically Abused: Not on file  . Sexually Abused: Not on file    Review of Systems: Pertinent positive and negative review of systems were noted in the above HPI section.  All other review of systems was otherwise negative.  Physical  Exam: Vital signs in last 24 hours: Temp:  [97.9 F (36.6 C)-98.3 F (36.8 C)] 97.9 F (36.6 C) (03/09 0531) Pulse Rate:  [63-80] 66 (03/09 0531) Resp:  [16-20] 17 (03/09 0531) BP: (112-133)/(70-88) 129/77 (03/09 0531) SpO2:  [97 %-100 %] 97 % (03/09 0531) Weight:  [105 kg] 105 kg (03/09 0538)   General:   Alert,  Well-developed, well-nourished, white male pleasant and cooperative in NAD.  Jaundiced Head:  Normocephalic and atraumatic. Eyes:  Sclera anicteric   conjunctiva pink. Ears:  Normal auditory acuity. Nose:  No deformity, discharge,  or lesions. Mouth:  No deformity or lesions.   Neck:  Supple; no masses or thyromegaly. Lungs:  Clear throughout to auscultation.   No wheezes, crackles, or rhonchi. Heart:  Regular rate and rhythm; no murmurs, clicks, rubs,  or gallops. Abdomen:  Soft, basically nontender BS active,nonpalp mass or hsm.   Rectal:  Deferred  Msk:  Symmetrical without gross deformities. . Pulses:  Normal pulses noted. Extremities:  Without clubbing or edema. Neurologic:  Alert and  oriented x4;  grossly normal neurologically. Skin:  Intact without significant lesions or rashes.. Psych:  Alert and cooperative. Normal mood and affect.  Intake/Output from previous day: 03/08 0701 - 03/09 0700 In: 259.1 [I.V.:186.3; IV Piggyback:72.8] Out: -  Intake/Output this shift: No intake/output data recorded.  Lab Results: Recent Labs    01/14/20 1950 01/16/20 1552 01/17/20 0546  WBC 6.4 7.0 6.4  HGB 13.7 15.4 14.4  HCT 41.1 46.2 43.1  PLT 201 260 269   BMET Recent Labs    01/14/20 1950 01/16/20 1552 01/17/20 0546  NA 138 141 143  K 3.6 3.6 3.8  CL 105 107 108  CO2 25 25 27   GLUCOSE 117* 121* 103*  BUN 12 18 19   CREATININE 0.70 0.75 0.91  CALCIUM 8.3* 9.1 9.1   LFT Recent Labs    01/17/20 0546  PROT 7.8  ALBUMIN 3.6  AST 260*  ALT 806*  ALKPHOS 200*  BILITOT 5.7*   PT/INR Recent Labs    01/17/20 0546  LABPROT 12.3  INR 0.9    Hepatitis Panel No results for input(s): HEPBSAG, HCVAB, HEPAIGM, HEPBIGM in the last 72 hours.     IMPRESSION:  #35 44 year old white male with 2 to 3-week illness with generalized upper abdominal discomfort, postprandial abdominal pain, decrease in appetite intermittent nausea, weight loss of 12 pounds and persistent significant fatigue. Onset of jaundice within the past week  Initial work-up was concerning for acute cholecystitis on ultrasound and MRCP. However HIDA scan today negative, and acute hepatitis panel returned positive for hepatitis A.  #2 probable underlying hepatic steatosis  Plan; patient's lab parameters are stable and actually improved.  Management of acute hepatitis A is supportive. I discussed diagnosis with patient this afternoon, and management. He is stable from a GI perspective to be discharged from the hospital, may need note for work to remain out of work for the remainder of the week. Regular diet, good hydration, avoid all EtOH  Patient has been scheduled for office follow-up on 02/07/2020 with Amy Esterwood PA-C. He is scheduled for labs at our office on 02/01/2020. He was advised to call should he have any worsening of symptoms at any point prior to his visit.  He was advised to expect this to take several more weeks to completely resolve but fortunately he should have complete resolution.     Amy EsterwoodPA-C  01/17/2020, 9:04 AM   Attending physician's note   I have taken a history, examined the patient and reviewed the chart. I agree with the Advanced Practitioner's note, impression and recommendations.  44 year old male with abdominal discomfort and jaundice.  Abdominal ultrasound showed contracted gallbladder.  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  Hepatitis IgM positive  Acute LFT  abnormality likely secondary to acute hepatitis Transaminases and bilirubin improving  Patient currently has minimal symptoms, okay to discharge home from GI standpoint. Follow-up office visit/ LFT as scheduled  He should get vaccinated for hepatitis B at some point as outpatient GI will sign off, available if  have any questions  K. Denzil Magnuson , MD [phone]

## 2020-01-17 NOTE — Discharge Summary (Signed)
Physician Discharge Summary  Antonio Bright LTJ:[id number] DOB: 1976-01-23 DOA: 01/16/2020  PCP: Patient, No Pcp Per  Admit date: 01/16/2020 Discharge date: 01/17/2020  Admitted From: Home Disposition: Home   Recommendations for Outpatient Follow-up:  1. Follow up with GI is arranged on 3/30 after lab draw on 3/24.   Home Health: None Equipment/Devices: None Discharge Condition: Stable CODE STATUS: Full Diet recommendation: Heart healthy  Brief/Interim Summary: Antonio Bright is a 44 y.o. male with no significant medical history or follow up who presented to the ED today with continued jaundice after leaving the ED on 3/6 having been diagnosed with cholecystitis with obstructive jaundice. Work up at that time revealed elevated LFTs (AST 691, ALT 1,307, TBili 6.3), unremarkable CBC. RUQ U/S demonstrated positive sonographic Murphy sign and a thickened, contracted gallbladder with pericholecystic fluid and sludge as well as only partially visualized CBD at 65mm and increased hepatic echogenicity. Admission was recommended, though patient left AMA and returned 3/8 with continued jaundice and very mild discomfort mildly in RUQ without nausea, vomiting, diarrhea. LFTs remained elevated. MRCP was ordered and patient admitted on zosyn and IV fluids. MRCP again showed contracted and thick-walled gallbladder with edema in the porta hepatis and tracking adjacent to the descending duodenum, along with periportal edema and adjacent reactive lymph nodes felt to be consistent with acute cholecystitis. No biliary dilatation or evidence of choledocholithiasis. No definite gallstones identified. Subsequent HIDA showed no evidence of calculus cholecystitis. It did suggest hepatic dysfunction. Hepatitis testing was positive for acute hepatitis A. The patient passed oral challenge and is stable for discharge home.  Discharge Diagnoses:  Acute hepatitis A: With improving laboratory abnormalities, stable for  supportive care at home. GI follow up with labs has been arranged. Avoid EtOH  Tobacco use: Cessation counseling.   Obesity: Estimated body mass index is 32.29 kg/m as calculated from the following:   Height as of this encounter: 5\' 11"  (1.803 m).   Weight as of this encounter: 105 kg.  Discharge Instructions  Allergies as of 01/17/2020   No Known Allergies     Medication List    STOP taking these medications   amoxicillin-clavulanate 875-125 MG tablet Commonly known as: AUGMENTIN       No Known Allergies  Consultations:  GI  General surgery  Procedures/Studies: NM Hepatobiliary Liver Func  Result Date: 01/17/2020 CLINICAL DATA:  44 year old male with a history of right upper quadrant pain and fever EXAM: NUCLEAR MEDICINE HEPATOBILIARY IMAGING TECHNIQUE: Sequential images of the abdomen were obtained out to 60 minutes following intravenous administration of radiopharmaceutical. RADIOPHARMACEUTICALS:  7.41 mCi Tc-24m  Choletec IV COMPARISON:  MRI 01/16/2020 FINDINGS: Prompt uptake and biliary excretion of activity by the liver is seen. Gallbladder activity is visualized, consistent with patency of cystic duct. Biliary activity passes into small bowel, consistent with patent common bile duct. The biliary phase persists late into the study. IMPRESSION: Hepatic biliary study is negative for evidence of calculus cholecystitis. Evidence of hepatic dysfunction. Electronically Signed   By: 03/17/2020 D.O.   On: 01/17/2020 12:43   MR 3D Recon At Scanner  Result Date: 01/16/2020 CLINICAL DATA:  Right upper quadrant abdominal pain with nausea. Elevated liver function tests. Gallbladder wall thickening with pericholecystic fluid and sonographic Murphy sign on recent ultrasound of 01/14/2020. EXAM: MRI ABDOMEN WITHOUT AND WITH CONTRAST (INCLUDING MRCP) TECHNIQUE: Multiplanar multisequence MR imaging of the abdomen was performed both before and after the administration of intravenous  contrast. Heavily T2-weighted images of the biliary and pancreatic  ducts were obtained, and three-dimensional MRCP images were rendered by post processing. CONTRAST:  73mL GADAVIST GADOBUTROL 1 MMOL/ML IV SOLN COMPARISON:  Ultrasound 01/14/2020 FINDINGS: Lower chest: Small potentially reactive distal paraesophageal and pericardial lymph nodes. Hepatobiliary: The gallbladder appears contracted and thick-walled. No biliary dilatation. Periportal edema is present. There is only equivocal dropout of signal in liver on out of phase images compared to in phase images, which may reflect minimal steatosis. No definite stones in the visualized gallbladder. No appreciable filling defect in the biliary tree to favor choledocholithiasis. Posteriorly in the right hepatic lobe, a 1.1 by 0.8 cm T2 hyperintense lesion demonstrates arterial phase rim enhancement and subsequent diffuse and delayed enhancement compatible with hemangioma Pancreas:  Unremarkable Spleen:  Unremarkable Adrenals/Urinary Tract:  Unremarkable Stomach/Bowel: Unremarkable Vascular/Lymphatic: Porta hepatis nodes measure up to 1.1 cm in short axis and are likely reactive. Smaller right gastric and retroperitoneal lymph nodes in the upper abdomen are probably reactive as well. Other: Mild edema the porta hepatis and tracking adjacent to the descending duodenum as shown on images 24 through 30 of series 17. Musculoskeletal: Unremarkable IMPRESSION: 1. The gallbladder is contracted and thick-walled. There is mild edema in the porta hepatis and tracking adjacent to the descending duodenum, along with periportal edema and adjacent reactive lymph nodes. This likely reflects acute cholecystitis. 2. There is no biliary dilatation or evidence of choledocholithiasis. No definite gallstones identified. 3. There is some equivocal dropout of signal in liver on out of phase images compared to in phase images, which may reflect minimal steatosis. There is also a small  hemangioma in the right hepatic lobe. Electronically Signed   By: Van Clines M.D.   On: 01/16/2020 19:37   MR ABDOMEN MRCP W WO CONTAST  Result Date: 01/16/2020 CLINICAL DATA:  Right upper quadrant abdominal pain with nausea. Elevated liver function tests. Gallbladder wall thickening with pericholecystic fluid and sonographic Murphy sign on recent ultrasound of 01/14/2020. EXAM: MRI ABDOMEN WITHOUT AND WITH CONTRAST (INCLUDING MRCP) TECHNIQUE: Multiplanar multisequence MR imaging of the abdomen was performed both before and after the administration of intravenous contrast. Heavily T2-weighted images of the biliary and pancreatic ducts were obtained, and three-dimensional MRCP images were rendered by post processing. CONTRAST:  29mL GADAVIST GADOBUTROL 1 MMOL/ML IV SOLN COMPARISON:  Ultrasound 01/14/2020 FINDINGS: Lower chest: Small potentially reactive distal paraesophageal and pericardial lymph nodes. Hepatobiliary: The gallbladder appears contracted and thick-walled. No biliary dilatation. Periportal edema is present. There is only equivocal dropout of signal in liver on out of phase images compared to in phase images, which may reflect minimal steatosis. No definite stones in the visualized gallbladder. No appreciable filling defect in the biliary tree to favor choledocholithiasis. Posteriorly in the right hepatic lobe, a 1.1 by 0.8 cm T2 hyperintense lesion demonstrates arterial phase rim enhancement and subsequent diffuse and delayed enhancement compatible with hemangioma Pancreas:  Unremarkable Spleen:  Unremarkable Adrenals/Urinary Tract:  Unremarkable Stomach/Bowel: Unremarkable Vascular/Lymphatic: Porta hepatis nodes measure up to 1.1 cm in short axis and are likely reactive. Smaller right gastric and retroperitoneal lymph nodes in the upper abdomen are probably reactive as well. Other: Mild edema the porta hepatis and tracking adjacent to the descending duodenum as shown on images 24 through 30  of series 17. Musculoskeletal: Unremarkable IMPRESSION: 1. The gallbladder is contracted and thick-walled. There is mild edema in the porta hepatis and tracking adjacent to the descending duodenum, along with periportal edema and adjacent reactive lymph nodes. This likely reflects acute cholecystitis. 2. There is  no biliary dilatation or evidence of choledocholithiasis. No definite gallstones identified. 3. There is some equivocal dropout of signal in liver on out of phase images compared to in phase images, which may reflect minimal steatosis. There is also a small hemangioma in the right hepatic lobe. Electronically Signed   By: Gaylyn Rong M.D.   On: 01/16/2020 19:37   US Abdomen Limited RUQ  Result Date: 01/14/2020 CLINICAL DATA:  Postprandial upper abdominal pain with nausea for 6 days EXAM: ULTRASOUND ABDOMEN LIMITED RIGHT UPPER QUADRANT COMPARISON:  None. FINDINGS: Gallbladder: Gallbladder appears partially contracted. There is heterogeneous biliary sludge in the gallbladder including several partially echogenic shadowing foci, likely partially calcified and/or pneumatized gallstones largest measuring up to 5 mm in size. Gallbladder wall thickness measures up to 6 mm, which is thickened. Small volume of pericholecystic fluid is noted. Sonographic Eulah Pont sign is reportedly positive. Common bile duct: Diameter: Poorly visualized. Proximal visible portion measures 4 mm, nondilated. Liver: Diffusely increased hepatic echogenicity with loss of definition of the portal triads and diminished posterior through transmission compatible with hepatic steatosis. No focal lesion identified. Portal vein is patent on color Doppler imaging with normal direction of blood flow towards the liver. Other: None. IMPRESSION: Features compatible with acute cholecystitis including gallbladder wall thickening, pericholecystic fluid, and a positive sonographic Murphy sign. Gallbladder appears contracted around heterogeneous  biliary sludge and echogenic foci, likely gallstones. Increased hepatic echogenicity, most often compatible with hepatic steatosis. Electronically Signed   By: Kreg Shropshire M.D.   On: 01/14/2020 20:29     Subjective: Feels well, actually. No pain or fever.   Discharge Exam: Vitals:   01/17/20 0531 01/17/20 1349  BP: 129/77 130/85  Pulse: 66 66  Resp: 17 18  Temp: 97.9 F (36.6 C) 98.3 F (36.8 C)  SpO2: 97% 100%   General: Pt is alert, awake, not in acute distress +Jaundice. Cardiovascular: RRR, S1/S2 +, no rubs, no gallops Respiratory: CTA bilaterally, no wheezing, no rhonchi Abdominal: Soft, NT, ND, bowel sounds + Extremities: No edema, no cyanosis  Labs: Basic Metabolic Panel: Recent Labs  Lab 01/14/20 1950 01/16/20 1552 01/17/20 0546  NA 138 141 143  K 3.6 3.6 3.8  CL 105 107 108  CO2 25 25 27   GLUCOSE 117* 121* 103*  BUN 12 18 19   CREATININE 0.70 0.75 0.91  CALCIUM 8.3* 9.1 9.1   Liver Function Tests: Recent Labs  Lab 01/14/20 1950 01/16/20 1552 01/17/20 0546  AST 691* 309* 260*  ALT 1,307* 964* 806*  ALKPHOS 176* 207* 200*  BILITOT 6.3* 6.5* 5.7*  PROT 6.9 8.0 7.8  ALBUMIN 3.3* 3.6 3.6   Recent Labs  Lab 01/14/20 1950 01/16/20 1552  LIPASE 40 44   CBC: Recent Labs  Lab 01/14/20 1950 01/16/20 1552 01/17/20 0546  WBC 6.4 7.0 6.4  NEUTROABS 3.0  --   --   HGB 13.7 15.4 14.4  HCT 41.1 46.2 43.1  MCV 90.7 90.8 91.3  PLT 201 260 269   Hgb A1c Recent Labs    01/17/20 0546  HGBA1C 5.6   Urinalysis    Component Value Date/Time   COLORURINE AMBER (A) 01/16/2020 1616   APPEARANCEUR CLEAR 01/16/2020 1616   LABSPEC 1.025 01/16/2020 1616   PHURINE 5.0 01/16/2020 1616   GLUCOSEU NEGATIVE 01/16/2020 1616   HGBUR NEGATIVE 01/16/2020 1616   BILIRUBINUR MODERATE (A) 01/16/2020 1616   KETONESUR NEGATIVE 01/16/2020 1616   PROTEINUR NEGATIVE 01/16/2020 1616   NITRITE NEGATIVE 01/16/2020 1616   LEUKOCYTESUR  NEGATIVE 01/16/2020 1616    Microbiology Recent Results (from the past 240 hour(s))  Respiratory Panel by RT PCR (Flu A&B, Covid) - Urine, Clean Catch     Status: None   Collection Time: 01/16/20  4:16 PM   Specimen: Urine, Clean Catch  Result Value Ref Range Status   SARS Coronavirus 2 by RT PCR NEGATIVE NEGATIVE Final    Comment: (NOTE) SARS-CoV-2 target nucleic acids are NOT DETECTED. The SARS-CoV-2 RNA is generally detectable in upper respiratoy specimens during the acute phase of infection. The lowest concentration of SARS-CoV-2 viral copies this assay can detect is 131 copies/mL. A negative result does not preclude SARS-Cov-2 infection and should not be used as the sole basis for treatment or other patient management decisions. A negative result may occur with  improper specimen collection/handling, submission of specimen other than nasopharyngeal swab, presence of viral mutation(s) within the areas targeted by this assay, and inadequate number of viral copies (<131 copies/mL). A negative result must be combined with clinical observations, patient history, and epidemiological information. The expected result is Negative. Fact Sheet for Patients:  https://www.moore.com/ Fact Sheet for Healthcare Providers:  https://www.young.biz/ This test is not yet ap proved or cleared by the [place] FDA and  has been authorized for detection and/or diagnosis of SARS-CoV-2 by FDA under an Emergency Use Authorization (EUA). This EUA will remain  in effect (meaning this test can be used) for the duration of the COVID-19 declaration under Section 564(b)(1) of the Act, 21 U.S.C. section 360bbb-3(b)(1), unless the authorization is terminated or revoked sooner.    Influenza A by PCR NEGATIVE NEGATIVE Final   Influenza B by PCR NEGATIVE NEGATIVE Final    Comment: (NOTE) The Xpert Xpress SARS-CoV-2/FLU/RSV assay is intended as an aid in  the diagnosis of influenza from  Nasopharyngeal swab specimens and  should not be used as a sole basis for treatment. Nasal washings and  aspirates are unacceptable for Xpert Xpress SARS-CoV-2/FLU/RSV  testing. Fact Sheet for Patients: https://www.moore.com/ Fact Sheet for Healthcare Providers: https://www.young.biz/ This test is not yet approved or cleared by the [place] FDA and  has been authorized for detection and/or diagnosis of SARS-CoV-2 by  FDA under an Emergency Use Authorization (EUA). This EUA will remain  in effect (meaning this test can be used) for the duration of the  Covid-19 declaration under Section 564(b)(1) of the Act, 21  U.S.C. section 360bbb-3(b)(1), unless the authorization is  terminated or revoked. Performed at [facility], [address], [place], [place] [address]     Time coordinating discharge: Approximately 40 minutes  Tyrone Nine, MD  [facility] 01/17/2020, 3:55 PM

## 2020-01-17 NOTE — Progress Notes (Signed)
    YN:[id number] pain/jaundice  Subjective: No real change from last evening.  He is still mildly tender in the right upper quadrant.  Objective: Vital signs in last 24 hours: Temp:  [97.9 F (36.6 C)-98.3 F (36.8 C)] 97.9 F (36.6 C) (03/09 0531) Pulse Rate:  [63-80] 66 (03/09 0531) Resp:  [16-20] 17 (03/09 0531) BP: (112-133)/(70-88) 129/77 (03/09 0531) SpO2:  [97 %-100 %] 97 % (03/09 0531) Weight:  [105 kg] 105 kg (03/09 0538)  NPO 259 IV recorded Urine x 1 recorded Afebrile, FSS Labs:  CMP Latest Ref Rng & Units 01/17/2020 01/16/2020 01/14/2020  Total Bilirubin 0.3 - 1.2 mg/dL 5.7(H) 6.5(H) 6.3(H)  Alkaline Phos 38 - 126 U/L 200(H) 207(H) 176(H)  AST 15 - 41 U/L 260(H) 309(H) 691(H)  ALT 0 - 44 U/L 806(H) 964(H) 1,307(H)   WBC 6.4 >>7.0>>6.4 MRI 3/8:  The gallbladder is contracted and thick-walled. There is mild edema in the porta hepatis and tracking adjacent to the descending duodenum, along with periportal edema and adjacent reactive lymph nodes. This likely reflects acute cholecystitis. 2. There is no biliary dilatation or evidence of choledocholithiasis. No definite gallstones identified. 3. There is some equivocal dropout of signal in liver on out of phase images compared to in phase images, which may reflect minimal steatosis. There is also a small hemangioma in the right hepatic lobe. Intake/Output from previous day: 03/08 0701 - 03/09 0700 In: 259.1 [I.V.:186.3; IV Piggyback:72.8] Out: -  Intake/Output this shift: No intake/output data recorded.  General appearance: alert, cooperative and no distress Resp: clear to auscultation bilaterally Cardio: Regular rate and rhythm GI: Soft, positive bowel sounds no distention, slightly tender in the right upper quadrant. Extremities: extremities normal, atraumatic, no cyanosis or edema  Lab Results:  Recent Labs    01/16/20 1552 01/17/20 0546  WBC 7.0 6.4  HGB 15.4 14.4  HCT 46.2 43.1  PLT 260 269     BMET Recent Labs    01/16/20 1552 01/17/20 0546  NA 141 143  K 3.6 3.8  CL 107 108  CO2 25 27  GLUCOSE 121* 103*  BUN 18 19  CREATININE 0.75 0.91  CALCIUM 9.1 9.1   PT/INR Recent Labs    01/17/20 0546  LABPROT 12.3  INR 0.9    Recent Labs  Lab 01/14/20 1950 01/16/20 1552 01/17/20 0546  AST 691* 309* 260*  ALT 1,307* 964* 806*  ALKPHOS 176* 207* 200*  BILITOT 6.3* 6.5* 5.7*  PROT 6.9 8.0 7.8  ALBUMIN 3.3* 3.6 3.6     Lipase     Component Value Date/Time   LIPASE 44 01/16/2020 1552     Medications: . heparin  5,000 Units Subcutaneous Q8H  . nicotine  14 mg Transdermal Daily  . sodium chloride flush  3 mL Intravenous Once  . sodium chloride flush  3 mL Intravenous Q12H    Assessment/Plan  Jaundice with elevated LFT Possible Cholelithiasis/choledocholithiasis/cholecystitis - RUQ [place] 01/14/20 Tobacco use  FEN:  IV fluids/NPO ID:  Zosyn 3/8 >> DVT: SCD's Follow up:  TBD  Plan: After reviewing the MRCP, as Dr. Eliberto Ivory opinion we should get an acute hepatitis panel and a HIDA.  We will continue to keep him n.p.o. continue antibiotics.  If the hepatitis panel is negative and HIDA is positive we will try to do cholecystectomy later today.  LOS: 1 day    Darlisa Spruiell 01/17/2020 Please see Amion

## 2020-01-17 NOTE — Progress Notes (Signed)
Patient's HIDA scan is negative for cholecystitis.  He has been found to have reactive Hep A on his hep panel which is more consistent with his elevated LFTs than acalculous cholecystitis.  No plans for surgical intervention.  Will defer to GI and medicine for further treatment plans.  We will sign off.  Letha Cape 1:48 PM 01/17/2020

## 2020-01-18 LAB — HEPATITIS PANEL, ACUTE
HCV Ab: NONREACTIVE
Hep A IgM: REACTIVE — AB
Hep B C IgM: NONREACTIVE
Hepatitis B Surface Ag: NONREACTIVE

## 2020-02-07 ENCOUNTER — Ambulatory Visit: Payer: Managed Care, Other (non HMO) | Admitting: Physician Assistant

## 2021-07-01 IMAGING — US US ABDOMEN LIMITED
1 series · 13 of 25 positions shown · non-contrast
Comparison: None.

CLINICAL DATA: Postprandial upper abdominal pain with nausea for 6
days

EXAM:
ULTRASOUND ABDOMEN LIMITED RIGHT UPPER QUADRANT

[Series 1: us abdomen limited · 13 of 39 slices shown]
[im 1/39]
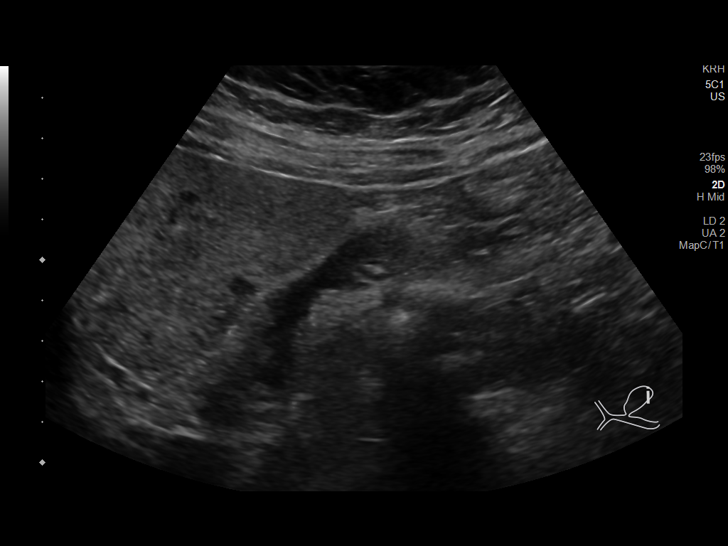
[im 4/39]
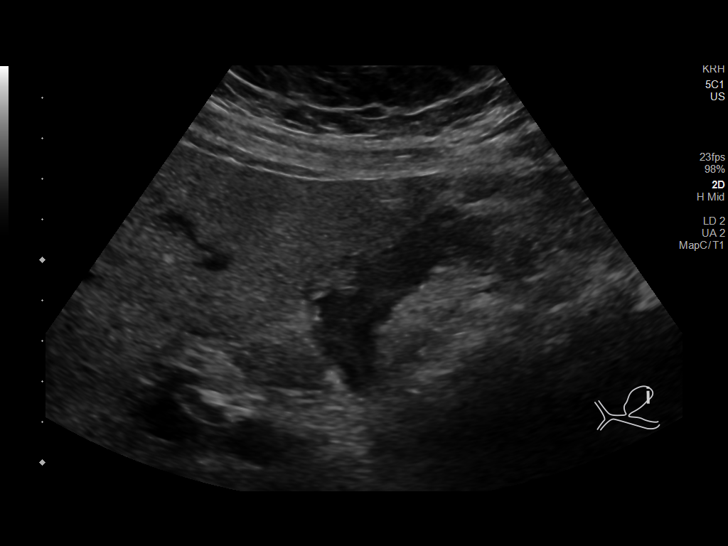
[im 7/39]
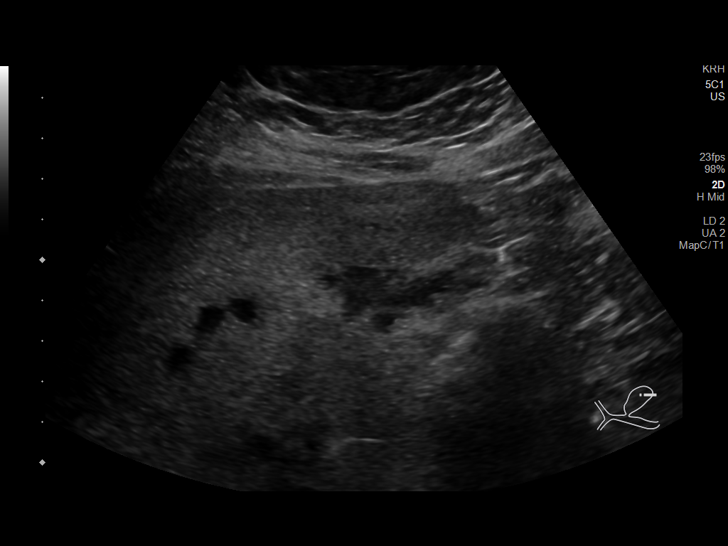
[im 10/39]
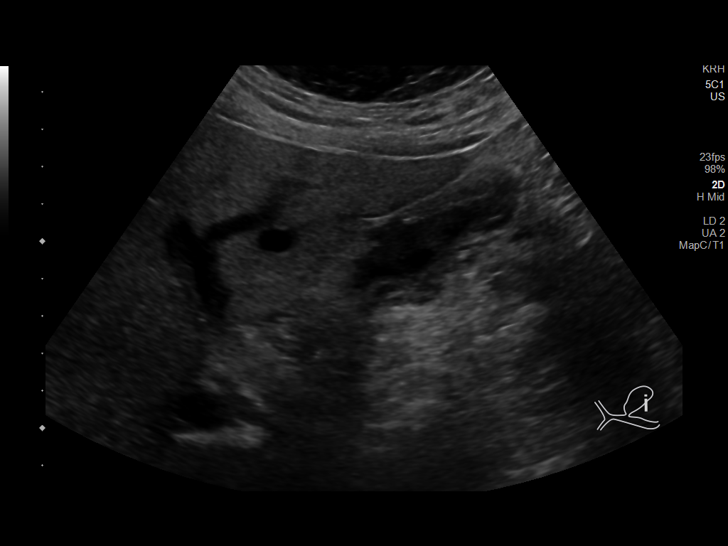
[im 13/39]
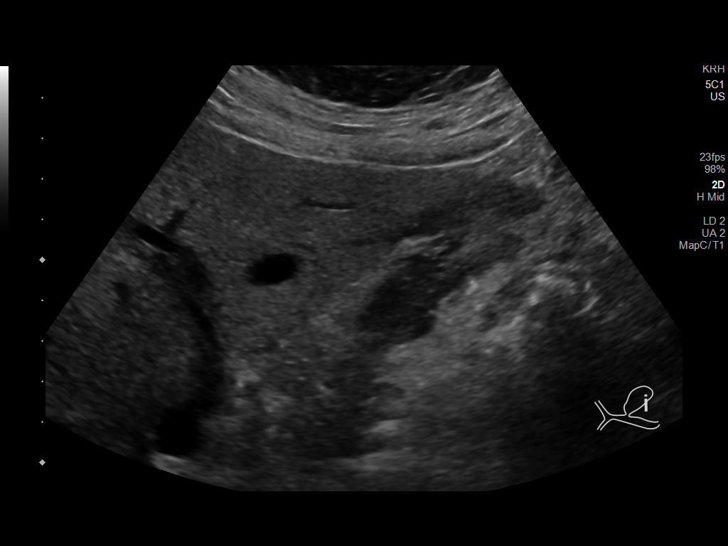
[im 16/39]
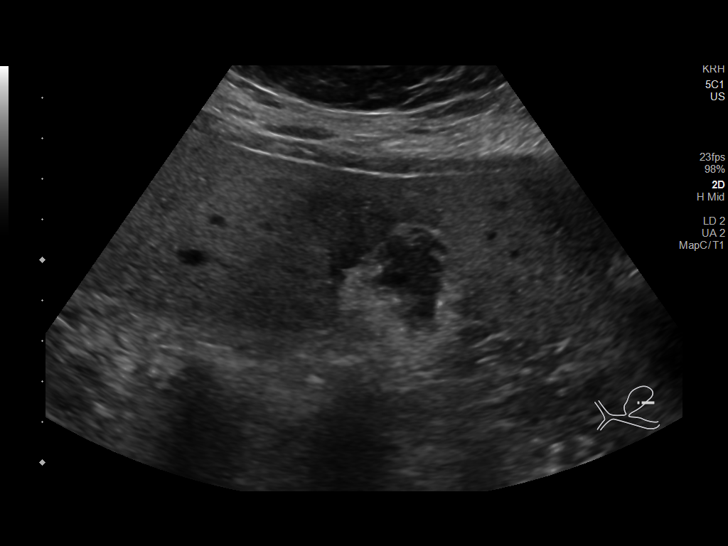
[im 20/39]
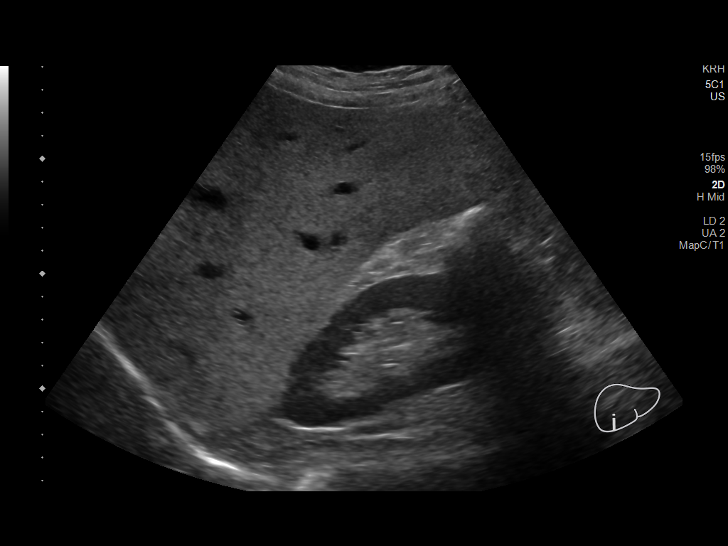
[im 23/39]
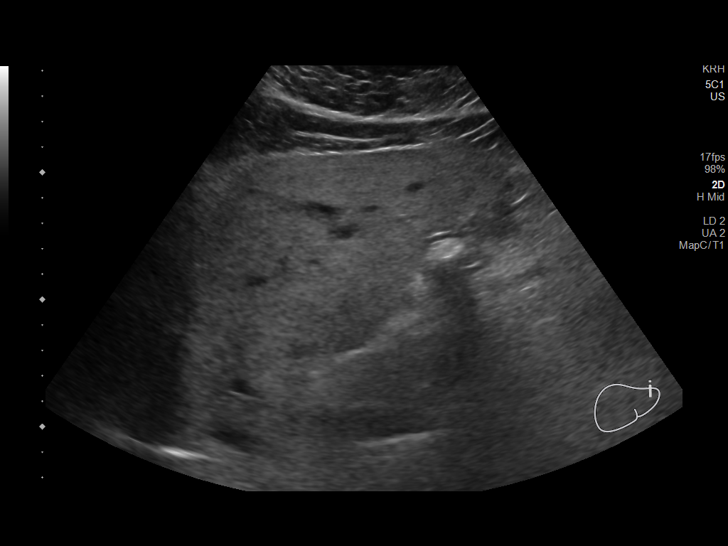
[im 26/39]
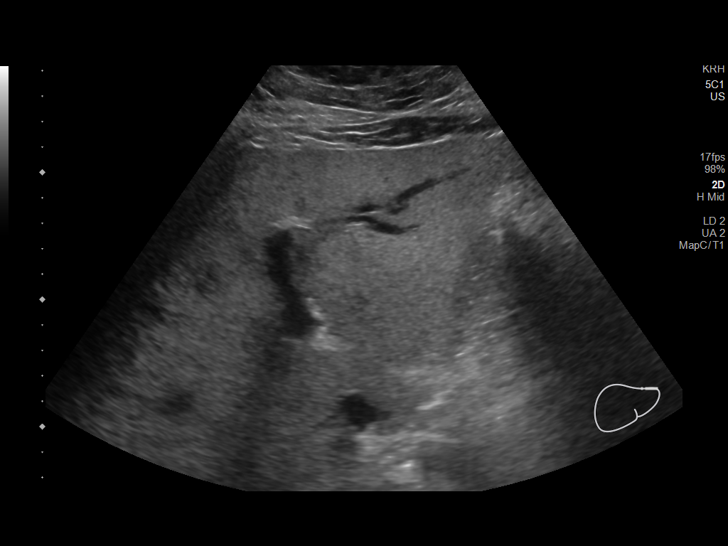
[im 29/39]
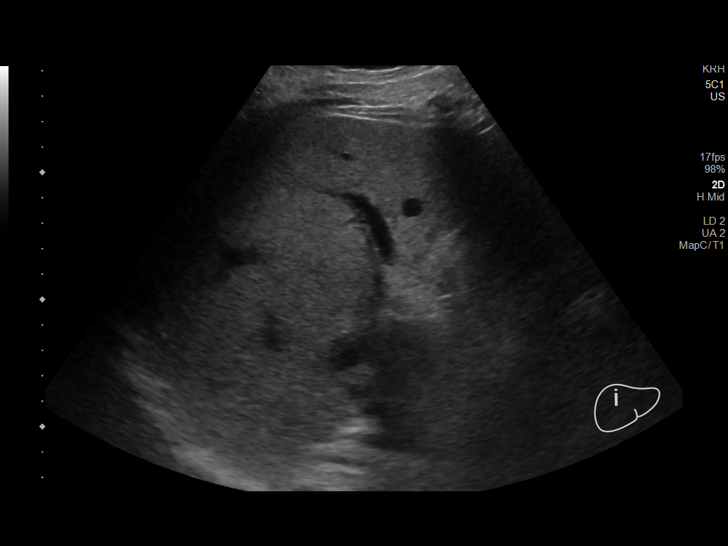
[im 32/39]
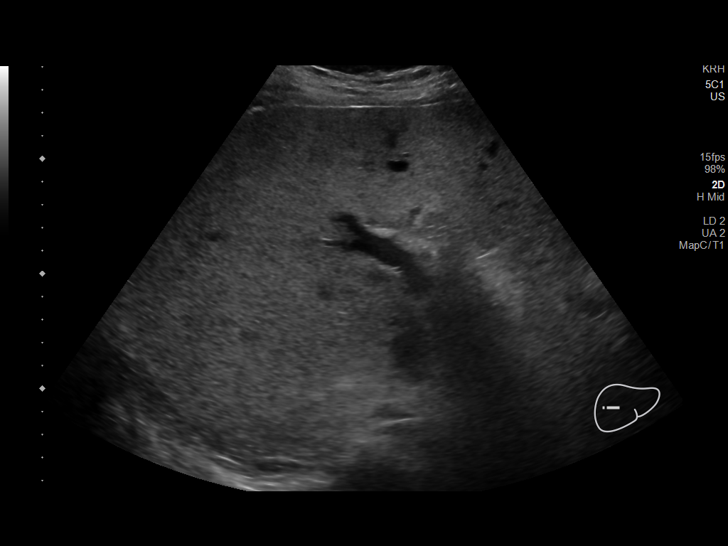
[im 35/39]
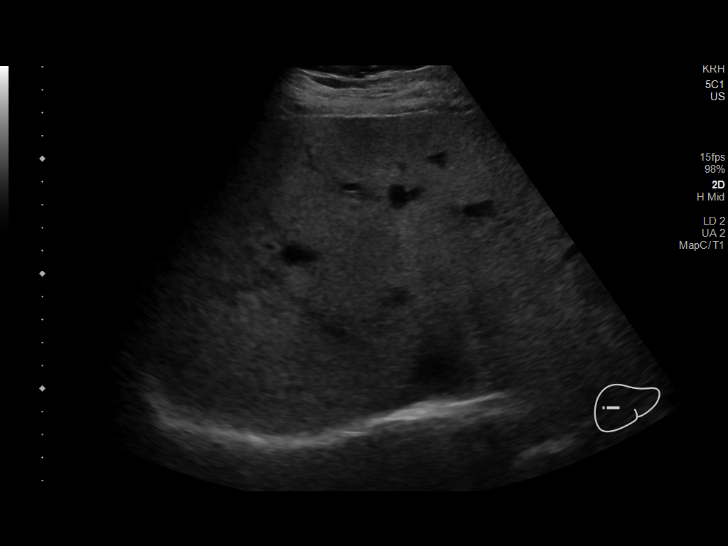
[im 39/39]
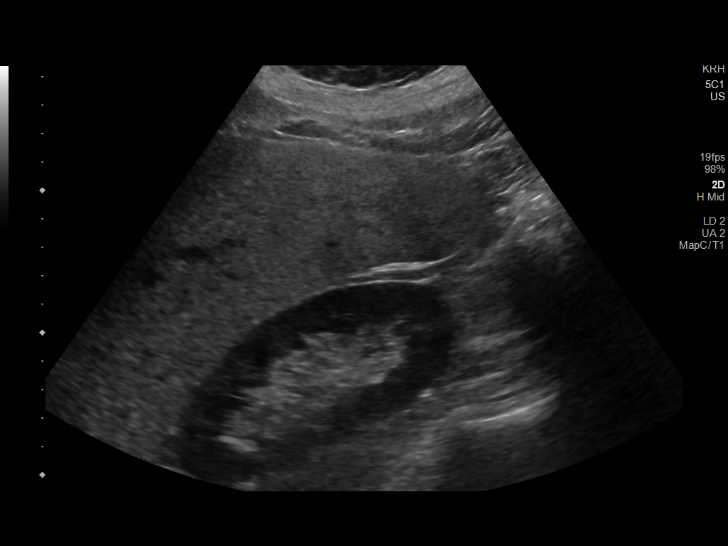

[13 of 25 positions shown; findings below may reference images not displayed]

FINDINGS: Gallbladder:

Gallbladder appears partially contracted. There is heterogeneous
biliary sludge in the gallbladder including several partially
echogenic shadowing foci, likely partially calcified and/or
pneumatized gallstones largest measuring up to 5 mm in size.
Gallbladder wall thickness measures up to 6 mm, which is thickened.
Small volume of pericholecystic fluid is noted. Sonographic Murphy
sign is reportedly positive.

Common bile duct:

Diameter: Poorly visualized. Proximal visible portion measures 4 mm,
nondilated.

Liver:

Diffusely increased hepatic echogenicity with loss of definition of
the portal triads and diminished posterior through transmission
compatible with hepatic steatosis. No focal lesion identified.
Portal vein is patent on color Doppler imaging with normal direction
of blood flow towards the liver.

Other: None.
IMPRESSION: Features compatible with acute cholecystitis including gallbladder
wall thickening, pericholecystic fluid, and a positive sonographic
Murphy sign.

Gallbladder appears contracted around heterogeneous biliary sludge
and echogenic foci, likely gallstones.

Increased hepatic echogenicity, most often compatible with hepatic
steatosis.

## 2021-07-04 IMAGING — NM NM HEPATOBILIARY IMAGE, INC GB
1 series · 6 of 6 positions shown · non-contrast
Comparison: MRI 01/16/2020

CLINICAL DATA: 43-year-old male with a history of right upper
quadrant pain and fever

EXAM:
NUCLEAR MEDICINE HEPATOBILIARY IMAGING
TECHNIQUE: Sequential images of the abdomen were obtained [DATE] minutes
following intravenous administration of radiopharmaceutical.
RADIOPHARMACEUTICALS:  7.41 mCi Oc-OOm  Choletec IV

[Series 1: biliary · 3.25mm/px · 6 of 60 frames shown]
[frame 6/60]
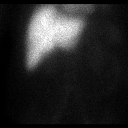
[frame 16/60]
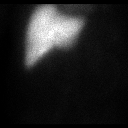
[frame 26/60]
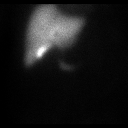
[frame 36/60]
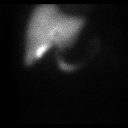
[frame 46/60]
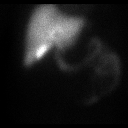
[frame 56/60]
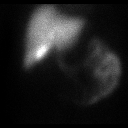

[6 of 6 positions shown; findings below may reference images not displayed]

FINDINGS: Prompt uptake and biliary excretion of activity by the liver is
seen. Gallbladder activity is visualized, consistent with patency of
cystic duct. Biliary activity passes into small bowel, consistent
with patent common bile duct.

The biliary phase persists late into the study.
IMPRESSION: Hepatic biliary study is negative for evidence of calculus
cholecystitis.

Evidence of hepatic dysfunction.
